# Patient Record
Sex: Female | Born: 1940 | Race: Black or African American | Hispanic: No | Marital: Married
Health system: Southern US, Community
[De-identification: ages and names within clinical notes are randomized; demographics above are authoritative.]

---

## 2016-08-16 ENCOUNTER — Emergency Department (HOSPITAL_COMMUNITY)
Admission: EM | Admit: 2016-08-16 | Discharge: 2016-08-28 | Disposition: E | Payer: Medicare Other | Attending: Emergency Medicine | Admitting: Emergency Medicine

## 2016-08-16 ENCOUNTER — Emergency Department (HOSPITAL_COMMUNITY): Payer: Medicare Other

## 2016-08-16 DIAGNOSIS — I469 Cardiac arrest, cause unspecified: Secondary | ICD-10-CM | POA: Insufficient documentation

## 2016-08-16 LAB — CBC WITH DIFFERENTIAL/PLATELET
Basophils Absolute: 0.3 10*3/uL — ABNORMAL HIGH (ref 0.0–0.1)
Basophils Relative: 1 %
EOS PCT: 1 %
Eosinophils Absolute: 0.3 10*3/uL (ref 0.0–0.7)
HEMATOCRIT: 48.4 % — AB (ref 36.0–46.0)
Hemoglobin: 15.7 g/dL — ABNORMAL HIGH (ref 12.0–15.0)
LYMPHS ABS: 5.2 10*3/uL — AB (ref 0.7–4.0)
Lymphocytes Relative: 17 %
MCH: 31.2 pg (ref 26.0–34.0)
MCHC: 32.4 g/dL (ref 30.0–36.0)
MCV: 96.2 fL (ref 78.0–100.0)
MONOS PCT: 8 %
Monocytes Absolute: 2.5 10*3/uL — ABNORMAL HIGH (ref 0.1–1.0)
NEUTROS PCT: 73 %
Neutro Abs: 22.5 10*3/uL — ABNORMAL HIGH (ref 1.7–7.7)
Platelets: 162 10*3/uL (ref 150–400)
RBC: 5.03 MIL/uL (ref 3.87–5.11)
RDW: 14.4 % (ref 11.5–15.5)
WBC: 30.8 10*3/uL — AB (ref 4.0–10.5)

## 2016-08-16 LAB — I-STAT TROPONIN, ED: Troponin i, poc: 0.12 ng/mL (ref 0.00–0.08)

## 2016-08-16 LAB — I-STAT CHEM 8, ED
BUN: 72 mg/dL — ABNORMAL HIGH (ref 6–20)
CALCIUM ION: 0.92 mmol/L — AB (ref 1.15–1.40)
CREATININE: 2.4 mg/dL — AB (ref 0.44–1.00)
Chloride: 101 mmol/L (ref 101–111)
GLUCOSE: 242 mg/dL — AB (ref 65–99)
HCT: 52 % — ABNORMAL HIGH (ref 36.0–46.0)
HEMOGLOBIN: 17.7 g/dL — AB (ref 12.0–15.0)
POTASSIUM: 5.7 mmol/L — AB (ref 3.5–5.1)
Sodium: 128 mmol/L — ABNORMAL LOW (ref 135–145)
TCO2: 19 mmol/L (ref 0–100)

## 2016-08-16 LAB — I-STAT CG4 LACTIC ACID, ED: LACTIC ACID, VENOUS: 5.43 mmol/L — AB (ref 0.5–1.9)

## 2016-08-16 MED ORDER — SODIUM CHLORIDE 0.9 % IV BOLUS (SEPSIS)
1000.0000 mL | Freq: Once | INTRAVENOUS | Status: AC
  Administered 2016-08-16: 1000 mL via INTRAVENOUS

## 2016-08-16 MED ORDER — ASPIRIN 300 MG RE SUPP: 300.0000 mg | RECTAL | Status: DC

## 2016-08-16 MED ORDER — EPINEPHRINE PF 1 MG/10ML IJ SOSY
PREFILLED_SYRINGE | INTRAMUSCULAR | Status: AC | PRN
  Administered 2016-08-16 (×5): 1 mg via INTRAVENOUS

## 2016-08-16 MED ORDER — PROPOFOL 1000 MG/100ML IV EMUL
INTRAVENOUS | Status: AC
  Filled 2016-08-16: qty 100

## 2016-08-16 MED ORDER — PROPOFOL 1000 MG/100ML IV EMUL: 5.0000 ug/kg/min | INTRAVENOUS | Status: DC

## 2016-08-16 MED ORDER — DEXTROSE 5 % IV SOLN
5.0000 ug/min | INTRAVENOUS | Status: DC
  Administered 2016-08-16: 5 ug/min via INTRAVENOUS

## 2016-08-16 MED ORDER — SODIUM BICARBONATE 8.4 % IV SOLN
INTRAVENOUS | Status: AC | PRN
  Administered 2016-08-16: 100 meq via INTRAVENOUS

## 2016-08-16 MED ORDER — MORPHINE SULFATE (PF) 4 MG/ML IV SOLN
INTRAVENOUS | Status: AC
  Administered 2016-08-16: 4 mg
  Filled 2016-08-16: qty 1

## 2016-08-16 MED FILL — Medication: Qty: 1 | Status: AC

## 2016-08-28 NOTE — Code Documentation (Signed)
Family decided to make patient comfort care, if pulses lost again

## 2016-08-28 NOTE — ED Notes (Signed)
Paged Dr[facility] Jame[facility]

## 2016-08-28 NOTE — Code Documentation (Signed)
Critical care at bedside  

## 2016-08-28 NOTE — ED Notes (Signed)
Pt's family in room, 4mg  morphine give, monitor turned to comfort care.

## 2016-08-28 NOTE — Code Documentation (Signed)
Pulses check, no pulse

## 2016-08-28 NOTE — Code Documentation (Signed)
Pt comes via [facility], was being driven by family to hospital for not feeling well, collapsed in car, CPR initiated, one hour downtime total, 8 epi, 5 versed, went into vtach, 300 amio, then PEA and ROSC then CPR resumed.

## 2016-08-28 NOTE — Progress Notes (Signed)
RT NOTE:   PT arrived by EMS, CPR in progress, 8.0 ETT / 24 @ lip. ETT placement confirmed with glidescope by MD. RT manually bagged patient throughout multiple rounds of CPR. Per MD patient placed on vent CPAP/PS for comfort care purpose. Family @ bedside. RT will monitor.

## 2016-08-28 NOTE — ED Notes (Signed)
Paged Chaplain X 5 times

## 2016-08-28 NOTE — Consult Note (Addendum)
Discussed patient with EDP - 76 y/o W who presented w/ one week of URI sx and LE edema. She complained of back pain and her family summened EMS. Upon arrival, she was unresponsive, pulseless and apneic. She was intubated and CPR was started. She was brought to the ED after 1 hour of ACLS, with intermittent loss of pulses. I spoke with the family (2 daughters) along with the EDP and recommended transition to comfort care and ceasing ACLS, which they agreed to. I recommended changing her vent to PS for comfort, stopping levophed, and giving 4 mg of morphine.  CRITICAL CARE Performed by: Jamie KatoRIMBLE, Kellin Fifer   Total critical care time: 30 minutes  Critical care time was exclusive of separately billable procedures and treating other patients.  Critical care was necessary to treat or prevent imminent or life-threatening deterioration.  Critical care was time spent personally by me on the following activities: development of treatment plan with patient and/or surrogate as well as nursing, discussions with consultants, evaluation of patient's response to treatment, examination of patient, obtaining history from patient or surrogate, ordering and performing treatments and interventions, ordering and review of laboratory studies, ordering and review of radiographic studies, pulse oximetry and re-evaluation of patient's condition.   Jamie KatoAaron Manya Balash, MD Pulmonary & Critical Care Medicine August 16, 2016, 2:38 AM

## 2016-08-28 NOTE — Code Documentation (Signed)
Ice packs placed to initiate code cool

## 2016-08-28 NOTE — Code Documentation (Signed)
Family at beside. Family given emotional support. 

## 2016-08-28 NOTE — Code Documentation (Signed)
Patient time of death occurred at 0251.  

## 2016-08-28 NOTE — ED Provider Notes (Signed)
[facility]-EMERGENCY DEPT Provider Note   CSN: [id number] Arrival date & time:      By signing my name below, I, Avnee Patel, attest that this documentation has been prepared under the direction and in the presence of Glynn Octave, MD  Electronically Signed: Clovis Pu, ED Scribe. 09-10-16. 2:12 AM.   History   Chief Complaint Chief Complaint  Patient presents with  . CPR    The history is provided by the EMS personnel. No language interpreter was used.   HPI Comments: LEVEL 5 CAVEAT DUE TO UNRESPONSIVENESS   Grace Rocha is a 76 y.o. female who presents to the Emergency Department in cardiac arrest with over an hour CPR PTA. Per EMS, they were initially called out for respiratory distress and found the pt slumped over the steering wheel of her car unresponsive. Pt was given 8 doses of epinephrine and was shocked 3 times prior to arrival to the ED. EMS reports her blood sugar was 105, she was tubed and given 5 of versed en route to ED. No signs of trauma reported.   No past medical history on file.  There are no active problems to display for this patient.   No past surgical history on file.  OB History    No data available       Home Medications    Prior to Admission medications   Not on File    Family History No family history on file.  Social History Social History  Substance Use Topics  . Smoking status: Not on file  . Smokeless tobacco: Not on file  . Alcohol use Not on file     Allergies   Patient has no allergy information on record.   Review of Systems Review of Systems  Unable to perform ROS: Patient unresponsive   Physical Exam Updated Vital Signs BP (!) 193/142   Pulse 66   SpO2 100%   Physical Exam  Constitutional:  Unresponsive   HENT:  Head: Normocephalic and atraumatic.  Eyes:  Pupils 3 mm and constricted   Neck:  No meningismus.  Cardiovascular:  Pulses intact with chest compressions   Pulmonary/Chest: No respiratory  distress. She exhibits no tenderness.  Breath sounds equal with bagging   Abdominal: There is no tenderness.  Neurological:  No spontaneous movement   Skin: Capillary refill takes more than 3 seconds.  Cold and clammy  Nursing note and vitals reviewed.  ED Treatments / Results  DIAGNOSTIC STUDIES:  Oxygen Saturation is 100% intubated, normal by my interpretation.    Labs (all labs ordered are listed, but only abnormal results are displayed) Labs Reviewed  CBC WITH DIFFERENTIAL/PLATELET - Abnormal; Notable for the following:       Result Value   WBC 30.8 (*)    Hemoglobin 15.7 (*)    HCT 48.4 (*)    Neutro Abs 22.5 (*)    Lymphs Abs 5.2 (*)    Monocytes Absolute 2.5 (*)    Basophils Absolute 0.3 (*)    All other components within normal limits  I-STAT CG4 LACTIC ACID, ED - Abnormal; Notable for the following:    Lactic Acid, Venous 5.43 (*)    All other components within normal limits  I-STAT CHEM 8, ED - Abnormal; Notable for the following:    Sodium 128 (*)    Potassium 5.7 (*)    BUN 72 (*)    Creatinine, Ser 2.40 (*)    Glucose, Bld 242 (*)    Calcium, Ion 0.92 (*)  Hemoglobin 17.7 (*)    HCT 52.0 (*)    All other components within normal limits  I-STAT TROPOININ, ED - Abnormal; Notable for the following:    Troponin i, poc 0.12 (*)    All other components within normal limits  I-STAT CG4 LACTIC ACID, ED  I-STAT TROPOININ, ED  CBG MONITORING, ED  I-STAT CG4 LACTIC ACID, ED    EKG  EKG Interpretation  Date/Time:  Saturday 2016-08-20 02:03:04 EST Ventricular Rate:  44 PR Interval:  144 QRS Duration: 148 QT Interval:  520 QTC Calculation: 444 R Axis:   -83 Text Interpretation:  Marked sinus bradycardia Left axis deviation Non-specific intra-ventricular conduction block Inferior infarct , age undetermined Anterolateral infarct , age undetermined Abnormal ECG No previous ECGs available Confirmed by Manus Gunning  MD, Maleeya Peterkin [phone]) on 08-20-16 2:52:17 AM        Radiology Dg Chest Portable 1 View  Result Date: 08/20/2016 CLINICAL DATA:  Cardiac arrest with intubation. EXAM: PORTABLE CHEST 1 VIEW COMPARISON:  11/15/2015 FINDINGS: Endotracheal tube is just above the carina, measuring about 1.2 cm above the carina. Enteric tube tip is off the field of view below the left hemidiaphragm. Mild cardiac enlargement. Patchy peripheral infiltrates in the lungs appear chronic without change since prior study, likely representing chronic interstitial disease. No focal consolidation. No pneumothorax. Mediastinal contours appear intact. IMPRESSION: Endotracheal tube is low, measuring 1.2 cm above the carina. Cardiac enlargement. Chronic interstitial process to the lungs. No evidence of active pulmonary disease. Electronically Signed   By: Burman Nieves M.D.   On: August 20, 2016 02:17    Procedures Procedures (including critical care time)  Medications Ordered in ED Medications  EPINEPHrine (ADRENALIN) 1 MG/10ML injection (1 mg Intravenous Given Aug 20, 2016 0146)  sodium bicarbonate injection (100 mEq Intravenous Given 08-20-16 0146)     Initial Impression / Assessment and Plan / ED Course  I have reviewed the triage vital signs and the nursing notes.  Pertinent labs & imaging results that were available during my care of the patient were reviewed by me and considered in my medical decision making (see chart for details).     Patient brought in with CPR in progress. She has had approximately one hour CPR for PEA rhythm and 2 shocks for VT . She's received approximately 8 doses of epinephrine.  Patient did have brief return of pulses in the ED. CPR was continued for PEA after pulses were lost again. EKG without STEMI  Cardiopulmonary Resuscitation (CPR) Procedure Note Directed/Performed by: Glynn Octave I personally directed ancillary staff and/or performed CPR in an effort to regain return of spontaneous circulation and to maintain cardiac, neuro and  systemic perfusion.      EMERGENCY DEPARTMENT [place] CARDIAC EXAM "Study: Limited Ultrasound of the Heart and Pericardium"  INDICATIONS:Cardiac arrest Multiple views of the heart and pericardium were obtained in real-time with a multi-frequency probe.  PERFORMED UE:AVWUJW IMAGES ARCHIVED?: Yes LIMITATIONS:  Emergent procedure VIEWS USED: Subcostal 4 chamber and Parasternal long axis INTERPRETATION: Cardiac activity present, Pericardial effusion present, Cardiac tamponade absent and Decreased contractility No R heart strain. No pericardial effusion   Discussed with family after they arrived. They agree patient would not want aggressive measures. She has had ongoing CPR for greater than one hour with minimal episodes of return of spontaneous circulation. They're agreeable to DO NOT RESUSCITATE and no further CPR if her heart were to stop.  Seen at bedside with critical care doctor [place]. He agrees that further resuscitative efforts will  be futile. Family agreeable to progression to comfort care and no further escalation of efforts.  Patient progressed to PEA, and asystole. Death was pronounced at 251. Airway bedside.  Death pronounced at 251. D/w Dr. Suzy BouchardLokesh on call [facility] Dr[facility]  CRITICAL CARE Performed by: Glynn OctaveANCOUR, Bostyn Kunkler Total critical care time: 45 minutes Critical care time was exclusive of separately billable procedures and treating other patients. Critical care was necessary to treat or prevent imminent or life-threatening deterioration. Critical care was time spent personally by me on the following activities: development of treatment plan with patient and/or surrogate as well as nursing, discussions with consultants, evaluation of patient's response to treatment, examination of patient, obtaining history from patient or surrogate, ordering and performing treatments and interventions, ordering and review of laboratory studies, ordering and review of  radiographic studies, pulse oximetry and re-evaluation of patient's condition.  Final Clinical Impressions(s) / ED Diagnoses   Final diagnoses:  Cardiac arrest [facility](HCC)    New Prescriptions New Prescriptions   No medications on file  I personally performed the services described in this documentation, which was scribed in my presence. The recorded information has been reviewed and is accurate.    Glynn OctaveStephen Jimie Kuwahara, MD Jan 30, 2017 [phone]

## 2016-08-28 NOTE — Code Documentation (Signed)
Pulses check, no pulses present

## 2016-08-28 NOTE — Code Documentation (Signed)
Pulses  check, pulses present  

## 2016-08-28 NOTE — Code Documentation (Addendum)
PEA, no pulse, CPR resumed

## 2016-08-28 NOTE — Code Documentation (Signed)
Pulse check, weak pulse

## 2016-08-28 NOTE — Code Documentation (Signed)
Family updated as to patient's status.

## 2016-08-28 NOTE — Code Documentation (Signed)
Pulses lost, CPR started.

## 2016-08-28 DEATH — deceased

## 2018-03-01 IMAGING — CR DG CHEST 1V PORT
1 series · 1 of 1 positions shown · non-contrast
Comparison: 11/15/2015

CLINICAL DATA: Cardiac arrest with intubation.

EXAM:
PORTABLE CHEST 1 VIEW

[AP]
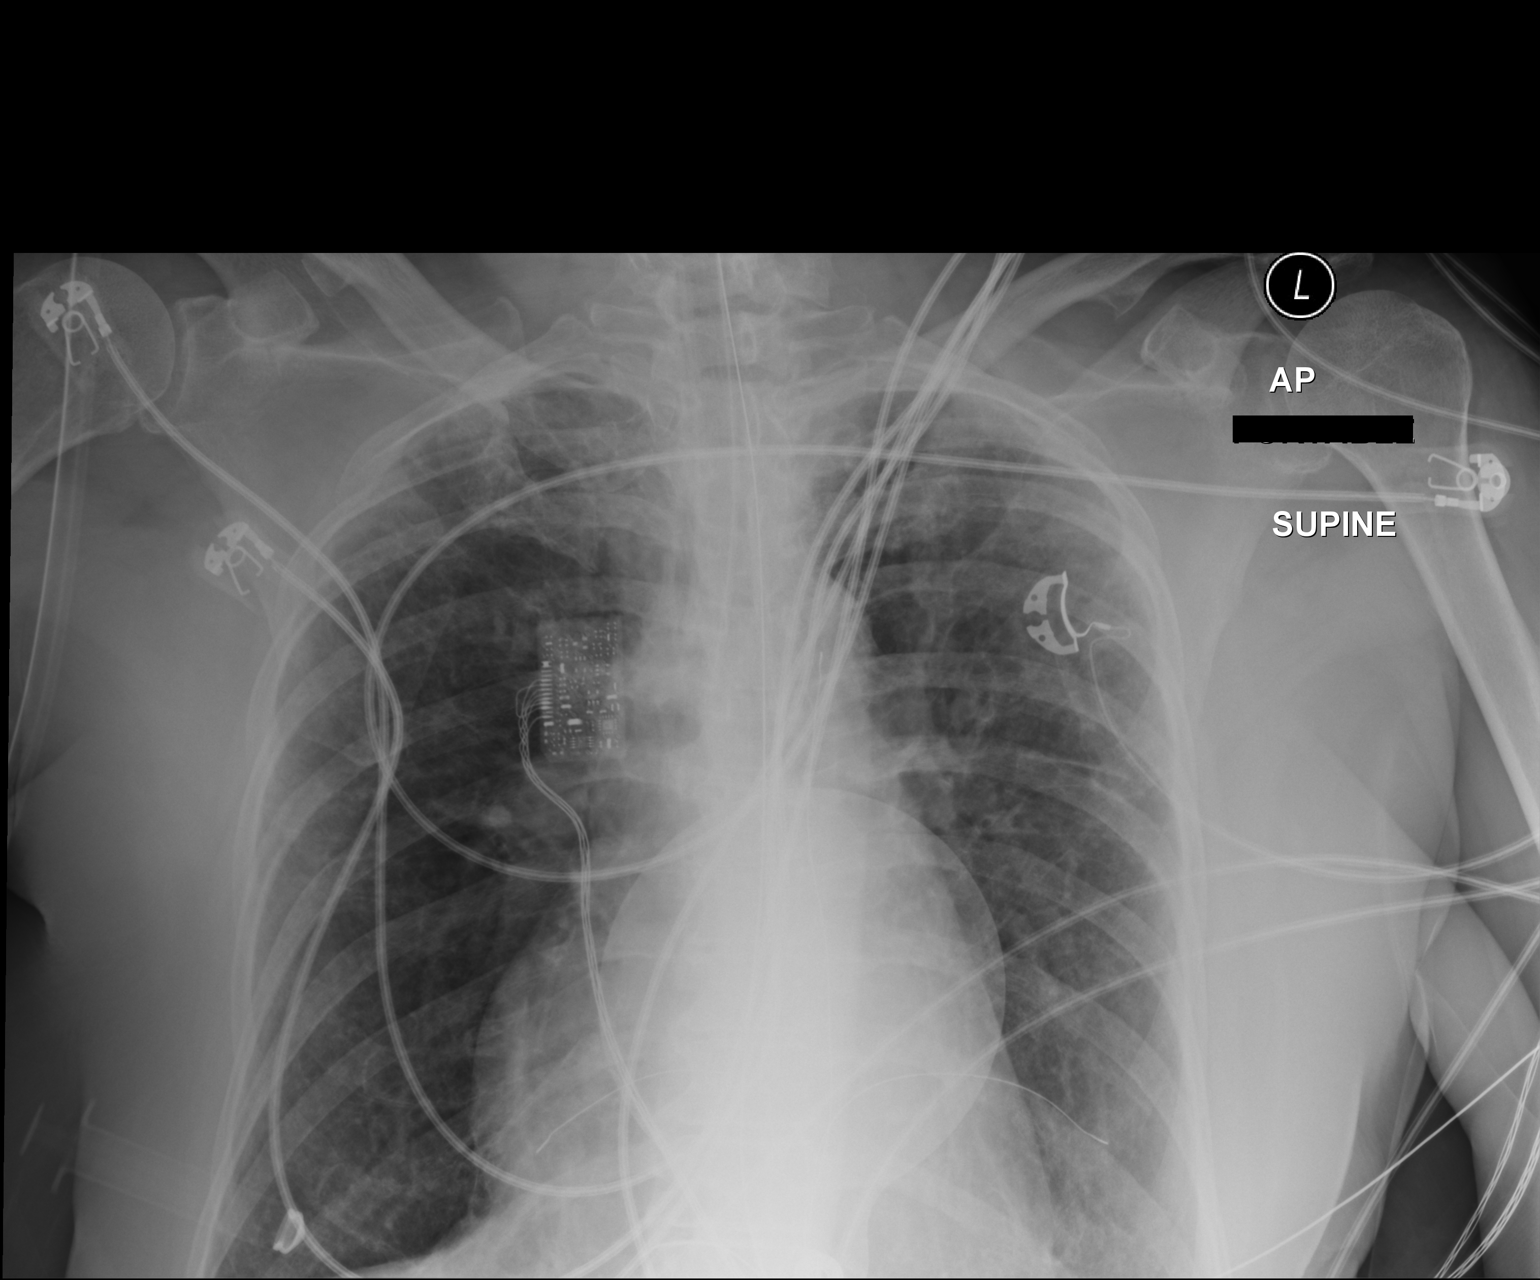

[1 of 1 positions shown; findings below may reference images not displayed]

FINDINGS: Endotracheal tube is just above the carina, measuring about 1.2 cm
above the carina. Enteric tube tip is off the field of view below
the left hemidiaphragm. Mild cardiac enlargement. Patchy peripheral
infiltrates in the lungs appear chronic without change since prior
study, likely representing chronic interstitial disease. No focal
consolidation. No pneumothorax. Mediastinal contours appear intact.
IMPRESSION: Endotracheal tube is low, measuring 1.2 cm above the carina. Cardiac
enlargement. Chronic interstitial process to the lungs. No evidence
of active pulmonary disease.
